# Patient Record
Sex: Male | Born: 1960 | Hispanic: No | Marital: Married | Smoking: Never smoker
Health system: Southern US, Community
[De-identification: ages and names within clinical notes are randomized; demographics above are authoritative.]

## PROBLEM LIST (undated history)

## (undated) HISTORY — PX: POLYPECTOMY: SHX149

## (undated) HISTORY — PX: COLONOSCOPY: SHX174

---

## 2002-02-27 ENCOUNTER — Emergency Department (HOSPITAL_COMMUNITY): Admission: EM | Admit: 2002-02-27 | Discharge: 2002-02-27 | Payer: Self-pay | Admitting: Emergency Medicine

## 2002-02-28 ENCOUNTER — Emergency Department (HOSPITAL_COMMUNITY): Admission: EM | Admit: 2002-02-28 | Discharge: 2002-02-28 | Payer: Self-pay | Admitting: Emergency Medicine

## 2002-03-10 ENCOUNTER — Emergency Department (HOSPITAL_COMMUNITY): Admission: EM | Admit: 2002-03-10 | Discharge: 2002-03-10 | Payer: Self-pay | Admitting: *Deleted

## 2010-05-17 ENCOUNTER — Encounter (INDEPENDENT_AMBULATORY_CARE_PROVIDER_SITE_OTHER): Payer: Self-pay | Admitting: *Deleted

## 2010-05-18 ENCOUNTER — Encounter (INDEPENDENT_AMBULATORY_CARE_PROVIDER_SITE_OTHER): Payer: Self-pay | Admitting: *Deleted

## 2010-05-20 ENCOUNTER — Ambulatory Visit: Payer: Self-pay | Admitting: Gastroenterology

## 2010-09-15 ENCOUNTER — Encounter (INDEPENDENT_AMBULATORY_CARE_PROVIDER_SITE_OTHER): Payer: Self-pay | Admitting: *Deleted

## 2010-09-16 ENCOUNTER — Ambulatory Visit
Admission: RE | Admit: 2010-09-16 | Discharge: 2010-09-16 | Payer: Self-pay | Source: Home / Self Care | Attending: Gastroenterology | Admitting: Gastroenterology

## 2010-09-30 ENCOUNTER — Other Ambulatory Visit: Payer: Self-pay | Admitting: Gastroenterology

## 2010-09-30 ENCOUNTER — Ambulatory Visit
Admission: RE | Admit: 2010-09-30 | Discharge: 2010-09-30 | Payer: Self-pay | Source: Home / Self Care | Attending: Gastroenterology | Admitting: Gastroenterology

## 2010-09-30 DIAGNOSIS — D126 Benign neoplasm of colon, unspecified: Secondary | ICD-10-CM

## 2010-10-04 ENCOUNTER — Encounter: Payer: Self-pay | Admitting: Gastroenterology

## 2010-10-06 NOTE — Procedures (Addendum)
Summary: Colonoscopy  Patient: [name] Note: All result statuses are Final unless otherwise noted.  Tests: (1) Colonoscopy (COL)   COL Colonoscopy           DONE     [facility]     [address]     [place], [place]  [address]           COLONOSCOPY PROCEDURE REPORT           PATIENT:  Jeremy Thompson, Jeremy Thompson  MR#:  [id number]     BIRTHDATE:  [birth year], 49 yrs. old  GENDER:  male     ENDOSCOPIST:  Judie Petit T. Russella Dar, MD, [facility]     Referred by:  Ancil Boozer, M.D.     PROCEDURE DATE:  09/30/2010     PROCEDURE:  Colonoscopy with biopsy and snare polypectomy     ASA CLASS:  Class I     INDICATIONS:  1) Routine Risk Screening     MEDICATIONS:   Fentanyl 50 mcg IV, Versed 6 mg IV     DESCRIPTION OF PROCEDURE:   After the risks benefits and     alternatives of the procedure were thoroughly explained, informed     consent was obtained.  Digital rectal exam was performed and     revealed no abnormalities.   The LB [id number]-[id number] endoscope     was introduced through the anus and advanced to the cecum, which     was identified by both the appendix and ileocecal valve, without     limitations.  The quality of the prep was good, using MoviPrep.     The instrument was then slowly withdrawn as the colon was fully     examined.     <<PROCEDUREIMAGES>>     FINDINGS:  A sessile polyp was found in the mid transverse colon.     It was 3 mm in size. The polyp was removed using cold biopsy     forceps.  A sessile polyp was found in the descending colon. It     was 5 mm in size. Polyp was snared without cautery. Retrieval was     successful. A sessile polyp was found in the sigmoid colon. It was     5 mm in size. Polyp was snared without cautery. Retrieval was     successful. A normal appearing cecum, ileocecal valve, and     appendiceal orifice were identified. The ascending, hepatic     flexure, splenic flexure, and rectum appeared unremarkable.     Retroflexed views in the rectum revealed no  abnormalities.  The time     to cecum =  1.5  minutes. The scope was then withdrawn (time =     13.33  min) from the patient and the procedure completed.           COMPLICATIONS:  None           ENDOSCOPIC IMPRESSION:     1) 3 mm sessile polyp in the mid transverse colon     2) 5 mm sessile polyp in the descending colon     3) 5 mm sessile polyp in the sigmoid colon           RECOMMENDATIONS:     1) Await pathology results     2) If the polyps are adenomatous (pre-cancerous) polyps,     colonoscopy in 5 years. Otherwise follow colorectal cancer     screening guidelines for "routine risk" patients with colonoscopy  in 10 years.           Venita Lick. Russella Dar, MD, [facility]           n.     eSIGNED:   Venita Lick. Stark at 09/30/2010 09:34 AM           Rayetta Humphrey, [id number]  Note: An exclamation mark (!) indicates a result that was not dispersed into the flowsheet. Document Creation Date: 09/30/2010 9:34 AM _______________________________________________________________________  (1) Order result status: Final Collection or observation date-time: 09/30/2010 09:29 Requested date-time:  Receipt date-time:  Reported date-time:  Referring Physician:   Ordering Physician: Claudette Head [phone]) Specimen Source:  Source: Launa Grill Order Number: [id number] Lab site:   Appended Document: Colonoscopy     Procedures Next Due Date:    Colonoscopy: 09/2015

## 2010-10-06 NOTE — Miscellaneous (Signed)
Summary: [facility] Previsit/prep  Clinical Lists Changes  Medications: Added new medication of MOVIPREP 100 GM  SOLR (PEG-KCL-NACL-NASULF-NA ASC-C) As per prep instructions. - Signed Rx of MOVIPREP 100 GM  SOLR (PEG-KCL-NACL-NASULF-NA ASC-C) As per prep instructions.;  #1 x 0;  Signed;  Entered by: Wyona Almas RN;  Authorized by: Meryl Dare MD [facility];  Method used: [facility]  [place] [address], [place], [place]  [address], Ph: [phone] or [phone], Fax: [phone] Observations: Added new observation of NKA: T (09/16/2010 15:25)    Prescriptions: MOVIPREP 100 GM  SOLR (PEG-KCL-NACL-NASULF-NA ASC-C) As per prep instructions.  #1 x 0   Entered by:   Wyona Almas RN   Authorized by:   Meryl Dare MD [facility]   Signed by:   Wyona Almas RN on 09/16/2010   Method used:   [facility] to        [facility]  [place] [address] (retail)       [address]       [place], [place]  [address]       Ph: [phone] or [phone]       Fax: [phone]   RxID:   [id number]

## 2010-10-06 NOTE — Miscellaneous (Signed)
Summary: previsit prep/rm  Clinical Lists Changes  Medications: Added new medication of MOVIPREP 100 GM  SOLR (PEG-KCL-NACL-NASULF-NA ASC-C) As per prep instructions. - Signed Rx of MOVIPREP 100 GM  SOLR (PEG-KCL-NACL-NASULF-NA ASC-C) As per prep instructions.;  #1 x 0;  Signed;  Entered by: Sherren Kerns RN;  Authorized by: Meryl Dare MD [facility];  Method used: [facility]  [place] [address], [place], [place]  [address], Ph: [phone] or [phone], Fax: [phone] Observations: Added new observation of ALLERGY REV: Done (05/20/2010 10:48) Added new observation of NKA: T (05/20/2010 10:48)    Prescriptions: MOVIPREP 100 GM  SOLR (PEG-KCL-NACL-NASULF-NA ASC-C) As per prep instructions.  #1 x 0   Entered by:   Sherren Kerns RN   Authorized by:   Meryl Dare MD [facility]   Signed by:   Sherren Kerns RN on 05/20/2010   Method used:   [facility]        [facility]  [place] [address] [place] (retail)       [address]       [place], [place]  [address]       Ph: [phone] or [phone]       Fax: [phone]   RxID:   [id number]

## 2010-10-06 NOTE — Letter (Signed)
Summary: Pre Visit Letter Revised  [facility]  [address] [place], [place] [address]   Phone: [phone]  Fax: [phone]        05/17/2010 MRN: [id number] Jeremy Thompson [address] [place], [place]  [address]             Procedure Date:  05/31/2010   Welcome to the Gastroenterology Division at [facility].    You are scheduled to see a nurse for your pre-procedure visit on 05/20/2010 at 11:00AM on the 3rd floor at [facility], [address]  We ask that you try to arrive at our office 15 minutes prior to your appointment time to allow for check-in.  Please take a minute to review the attached form.  If you answer "Yes" to one or more of the questions on the first page, we ask that you call the person listed at your earliest opportunity.  If you answer "No" to all of the questions, please complete the rest of the form and bring it to your appointment.    Your nurse visit will consist of discussing your medical and surgical history, your immediate family medical history, and your medications.   If you are unable to list all of your medications on the form, please bring the medication bottles to your appointment and we will list them.  We will need to be aware of both prescribed and over the counter drugs.  We will need to know exact dosage information as well.    Please be prepared to read and sign documents such as consent forms, a financial agreement, and acknowledgement forms.  If necessary, and with your consent, a friend or relative is welcome to sit-in on the nurse visit with you.  Please bring your insurance card so that we may make a copy of it.  If your insurance requires a referral to see a specialist, please bring your referral form from your primary care physician.  No co-pay is required for this nurse visit.     If you cannot keep your appointment, please call [phone] to cancel or reschedule prior to your appointment date.  This allows [place]  the opportunity to schedule an appointment for another patient in need of care.    Thank you for choosing  Gastroenterology for your medical needs.  We appreciate the opportunity to care for you.  Please visit [place] at our website  to learn more about our practice.  Sincerely, The Gastroenterology Division

## 2010-10-06 NOTE — Letter (Signed)
Summary: [facility] Instructions  [facility]  [address] [place], [place] [address]   Phone: [phone]  Fax: [phone]       Jeremy Thompson    1961-08-19    MRN: [id number]        Procedure Day /Date: Tuesday 05/31/2010     Arrival Time: 09:00AM     Procedure Time: 10:00AM     Location of Procedure:                    _X_  [facility] (4th Floor)                        PREPARATION FOR COLONOSCOPY WITH MOVIPREP   Starting 5 days prior to your procedure 05/26/2010 do not eat nuts, seeds, popcorn, corn, beans, peas,  salads, or any raw vegetables.  Do not take any fiber supplements (e.g. Metamucil, Citrucel, and Benefiber).  THE DAY BEFORE YOUR PROCEDURE         DATE:   09/26     DAY: Monday  1.  Drink clear liquids the entire day-NO SOLID FOOD  2.  Do not drink anything colored red or purple.  Avoid juices with pulp.  No orange juice.  3.  Drink at least 64 oz. (8 glasses) of fluid/clear liquids during the day to prevent dehydration and help the prep work efficiently.  CLEAR LIQUIDS INCLUDE: Water Jello Ice Popsicles Tea (sugar ok, no milk/cream) Powdered fruit flavored drinks Coffee (sugar ok, no milk/cream) Gatorade Juice: apple, white grape, white cranberry  Lemonade Clear bullion, consomm, broth Carbonated beverages (any kind) Strained chicken noodle soup Hard Candy                             4.  In the morning, mix first dose of MoviPrep solution:    Empty 1 Pouch A and 1 Pouch B into the disposable container    Add lukewarm drinking water to the top line of the container. Mix to dissolve    Refrigerate (mixed solution should be used within 24 hrs)  5.  Begin drinking the prep at 5:00 p.m. The MoviPrep container is divided by 4 marks.   Every 15 minutes drink the solution down to the next mark (approximately 8 oz) until the full liter is complete.   6.  Follow completed prep with 16 oz of clear liquid of your choice  (Nothing red or purple).  Continue to drink clear liquids until bedtime.  7.  Before going to bed, mix second dose of MoviPrep solution:    Empty 1 Pouch A and 1 Pouch B into the disposable container    Add lukewarm drinking water to the top line of the container. Mix to dissolve    Refrigerate  THE DAY OF YOUR PROCEDURE      DATE: 09/27     DAY: Tuesday  Beginning at 05:00AM  (5 hours before procedure):         1. Every 15 minutes, drink the solution down to the next mark (approx 8 oz) until the full liter is complete.  2. Follow completed prep with 16 oz. of clear liquid of your choice.    3. You may drink clear liquids until 08:00AM (2 HOURS BEFORE PROCEDURE).   MEDICATION INSTRUCTIONS  Unless otherwise instructed, you should take regular prescription medications with a small sip of water   as early  as possible the morning of your procedure.   Additional medication instructions: n/a         OTHER INSTRUCTIONS  You will need a responsible adult at least 52 years of age to accompany you and drive you home.   This person must remain in the waiting room during your procedure.  Wear loose fitting clothing that is easily removed.  Leave jewelry and other valuables at home.  However, you may wish to bring a book to read or  an iPod/MP3 player to listen to music as you wait for your procedure to start.  Remove all body piercing jewelry and leave at home.  Total time from sign-in until discharge is approximately 2-3 hours.  You should go home directly after your procedure and rest.  You can resume normal activities the  day after your procedure.  The day of your procedure you should not:   Drive   Make legal decisions   Operate machinery   Drink alcohol   Return to work  You will receive specific instructions about eating, activities and medications before you leave.    The above instructions have been reviewed and explained to me by   Sherren Kerns RN   May 20, 2010 11:33 AM     I fully understand and can verbalize these instructions _____________________________ Date _________

## 2010-10-06 NOTE — Letter (Signed)
Summary: [facility] Instructions  [facility]  [address] [place], [place] [address]   Phone: [phone]  Fax: [phone]       Jeremy Thompson    05-31-1961    MRN: [id number]        Procedure Day Dorna Bloom:  [name]  09/30/10     Arrival Time:  8:00AM     Procedure Time:  9:00AM     Location of Procedure:                    _ X_  [facility] (4th Floor)   PREPARATION FOR COLONOSCOPY WITH MOVIPREP   Starting 5 days prior to your procedure 09/25/10 do not eat nuts, seeds, popcorn, corn, beans, peas,  salads, or any raw vegetables.  Do not take any fiber supplements (e.g. Metamucil, Citrucel, and Benefiber).  THE DAY BEFORE YOUR PROCEDURE         DATE: 09/29/10  DAY: THURSDAY  1.  Drink clear liquids the entire day-NO SOLID FOOD  2.  Do not drink anything colored red or purple.  Avoid juices with pulp.  No orange juice.  3.  Drink at least 64 oz. (8 glasses) of fluid/clear liquids during the day to prevent dehydration and help the prep work efficiently.  CLEAR LIQUIDS INCLUDE: Water Jello Ice Popsicles Tea (sugar ok, no milk/cream) Powdered fruit flavored drinks Coffee (sugar ok, no milk/cream) Gatorade Juice: apple, white grape, white cranberry  Lemonade Clear bullion, consomm, broth Carbonated beverages (any kind) Strained chicken noodle soup Hard Candy                             4.  In the morning, mix first dose of MoviPrep solution:    Empty 1 Pouch A and 1 Pouch B into the disposable container    Add lukewarm drinking water to the top line of the container. Mix to dissolve    Refrigerate (mixed solution should be used within 24 hrs)  5.  Begin drinking the prep at 5:00 p.m. The MoviPrep container is divided by 4 marks.   Every 15 minutes drink the solution down to the next mark (approximately 8 oz) until the full liter is complete.   6.  Follow completed prep with 16 oz of clear liquid of your choice (Nothing red or purple).   Continue to drink clear liquids until bedtime.  7.  Before going to bed, mix second dose of MoviPrep solution:    Empty 1 Pouch A and 1 Pouch B into the disposable container    Add lukewarm drinking water to the top line of the container. Mix to dissolve    Refrigerate  THE DAY OF YOUR PROCEDURE      DATE: 09/30/10   DAY: FRIDAY  Beginning at 4:00AM (5 hours before procedure):         1. Every 15 minutes, drink the solution down to the next mark (approx 8 oz) until the full liter is complete.  2. Follow completed prep with 16 oz. of clear liquid of your choice.    3. You may drink clear liquids until 7:00AM (2 HOURS BEFORE PROCEDURE).   MEDICATION INSTRUCTIONS  Unless otherwise instructed, you should take regular prescription medications with a small sip of water   as early as possible the morning of your procedure.        OTHER INSTRUCTIONS  You will need a responsible adult at  least 52 years of age to accompany you and drive you home.   This person must remain in the waiting room during your procedure.  Wear loose fitting clothing that is easily removed.  Leave jewelry and other valuables at home.  However, you may wish to bring a book to read or  an iPod/MP3 player to listen to music as you wait for your procedure to start.  Remove all body piercing jewelry and leave at home.  Total time from sign-in until discharge is approximately 2-3 hours.  You should go home directly after your procedure and rest.  You can resume normal activities the  day after your procedure.  The day of your procedure you should not:   Drive   Make legal decisions   Operate machinery   Drink alcohol   Return to work  You will receive specific instructions about eating, activities and medications before you leave.    The above instructions have been reviewed and explained to me by  Wyona Almas RN  September 16, 2010 3:44 PM    I fully understand and can verbalize these  instructions _____________________________ Date _________

## 2010-10-12 NOTE — Letter (Signed)
Summary: Patient Notice- Polyp Results  [facility]  [address] [place], [place] [address]   Phone: [phone]  Fax: [phone]        October 04, 2010 MRN: [id number]    CORIE VAVRA [address] [place], [place]  [address]    Dear Mr. GRONINGER,  I am pleased to inform you that the colon polyp(s) removed during your recent colonoscopy was (were) found to be benign (no cancer detected) upon pathologic examination.  I recommend you have a repeat colonoscopy examination in 5 years to look for recurrent polyps, as having colon polyps increases your risk for having recurrent polyps or even colon cancer in the future.  Should you develop new or worsening symptoms of abdominal pain, bowel habit changes or bleeding from the rectum or bowels, please schedule an evaluation with either your primary care physician or with me.  Continue treatment plan as outlined the day of your exam.  Please call us if you are having persistent problems or have questions about your condition that have not been fully answered at this time.  Sincerely,  Meryl Dare MD [facility]  This letter has been electronically signed by your physician.  Appended Document: Patient Notice- Polyp Results LETTER MAILED

## 2015-05-24 ENCOUNTER — Other Ambulatory Visit: Payer: Self-pay | Admitting: Cardiology

## 2015-05-24 ENCOUNTER — Ambulatory Visit
Admission: RE | Admit: 2015-05-24 | Discharge: 2015-05-24 | Disposition: A | Discharge status: Home / Self Care | Payer: 59 | Source: Ambulatory Visit | Attending: Cardiology | Admitting: Cardiology

## 2015-05-24 DIAGNOSIS — R0789 Other chest pain: Secondary | ICD-10-CM

## 2015-09-16 ENCOUNTER — Encounter: Payer: Self-pay | Admitting: Gastroenterology

## 2015-10-08 ENCOUNTER — Ambulatory Visit (AMBULATORY_SURGERY_CENTER): Payer: Self-pay | Admitting: *Deleted

## 2015-10-08 VITALS — Ht 69.5 in | Wt 196.6 lb

## 2015-10-08 DIAGNOSIS — Z86010 Personal history of colonic polyps: Secondary | ICD-10-CM

## 2015-10-08 MED ORDER — NA SULFATE-K SULFATE-MG SULF 17.5-3.13-1.6 GM/177ML PO SOLN: 1.0000 | Freq: Once | ORAL | Status: DC

## 2015-10-08 NOTE — Progress Notes (Signed)
No egg or soy allergy known to patient  No issues with past sedation with any surgeries  or procedures, no intubation problems  No diet pills per patient No home 02 use per patient  No blood thinners per patient   emii dec;ined

## 2015-10-22 ENCOUNTER — Ambulatory Visit (AMBULATORY_SURGERY_CENTER): Payer: 59 | Admitting: Gastroenterology

## 2015-10-22 ENCOUNTER — Encounter: Payer: Self-pay | Admitting: Gastroenterology

## 2015-10-22 VITALS — BP 124/78 | HR 66 | Temp 97.7°F | Resp 19 | Ht 69.5 in | Wt 196.0 lb

## 2015-10-22 DIAGNOSIS — Z86010 Personal history of colonic polyps: Secondary | ICD-10-CM | POA: Diagnosis not present

## 2015-10-22 DIAGNOSIS — D124 Benign neoplasm of descending colon: Secondary | ICD-10-CM | POA: Diagnosis not present

## 2015-10-22 MED ORDER — SODIUM CHLORIDE 0.9 % IV SOLN: 500.0000 mL | INTRAVENOUS | Status: DC

## 2015-10-22 NOTE — Patient Instructions (Signed)
YOU HAD AN ENDOSCOPIC PROCEDURE TODAY AT [facility]:   Refer to the procedure report that was given to you for any specific questions about what was found during the examination.  If the procedure report does not answer your questions, please call your gastroenterologist to clarify.  If you requested that your care partner not be given the details of your procedure findings, then the procedure report has been included in a sealed envelope for you to review at your convenience later.  YOU SHOULD EXPECT: Some feelings of bloating in the abdomen. Passage of more gas than usual.  Walking can help get rid of the air that was put into your GI tract during the procedure and reduce the bloating. If you had a lower endoscopy (such as a colonoscopy or flexible sigmoidoscopy) you may notice spotting of blood in your stool or on the toilet paper. If you underwent a bowel prep for your procedure, you may not have a normal bowel movement for a few days.  Please Note:  You might notice some irritation and congestion in your nose or some drainage.  This is from the oxygen used during your procedure.  There is no need for concern and it should clear up in a day or so.  SYMPTOMS TO REPORT IMMEDIATELY:   Following lower endoscopy (colonoscopy or flexible sigmoidoscopy):  Excessive amounts of blood in the stool  Significant tenderness or worsening of abdominal pains  Swelling of the abdomen that is new, acute  Fever of 100F or higher   For urgent or emergent issues, a gastroenterologist can be reached at any hour by calling [phone].   DIET: Your first meal following the procedure should be a small meal and then it is ok to progress to your normal diet. Heavy or fried foods are harder to digest and may make you feel nauseous or bloated.  Likewise, meals heavy in dairy and vegetables can increase bloating.  Drink plenty of fluids but you should avoid alcoholic beverages for 24  hours.  ACTIVITY:  You should plan to take it easy for the rest of today and you should NOT DRIVE or use heavy machinery until tomorrow (because of the sedation medicines used during the test).    FOLLOW UP: Our staff will call the number listed on your records the next business day following your procedure to check on you and address any questions or concerns that you may have regarding the information given to you following your procedure. If we do not reach you, we will leave a message.  However, if you are feeling well and you are not experiencing any problems, there is no need to return our call.  We will assume that you have returned to your regular daily activities without incident.  If any biopsies were taken you will be contacted by phone or by letter within the next 1-3 weeks.  Please call us at [phone] if you have not heard about the biopsies in 3 weeks.    SIGNATURES/CONFIDENTIALITY: You and/or your care partner have signed paperwork which will be entered into your electronic medical record.  These signatures attest to the fact that that the information above on your After Visit Summary has been reviewed and is understood.  Full responsibility of the confidentiality of this discharge information lies with you and/or your care-partner.  Read all of the handouts given to you by your recovery room nurse.  Thank-you for choosing [place] for your healthcare needs today.

## 2015-10-22 NOTE — Progress Notes (Signed)
Report to PACU, RN, vss, BBS= Clear.  

## 2015-10-22 NOTE — Op Note (Signed)
[facility]  [address] [place], [address]   COLONOSCOPY PROCEDURE REPORT  PATIENT: Jeremy Thompson, Jeremy Thompson  MR#: [id number] BIRTHDATE: [birth year] , 26  yrs. old GENDER: male ENDOSCOPIST: Ladene Artist, MD, [facility] PROCEDURE DATE:  10/22/2015 PROCEDURE:   Colonoscopy, surveillance and Colonoscopy with snare polypectomy First Screening Colonoscopy - Avg.  risk and is 50 yrs.  old or older - No.  Prior Negative Screening - Now for repeat screening. N/A  History of Adenoma - Now for follow-up colonoscopy & has been > or = to 3 yrs.  Yes hx of adenoma.  Has been 3 or more years since last colonoscopy.  Polyps removed today? Yes ASA CLASS:   Class II INDICATIONS:Surveillance due to prior colonic neoplasia and PH Colon Adenoma. MEDICATIONS: Monitored anesthesia care and Propofol 200 mg IV  DESCRIPTION OF PROCEDURE:   After the risks benefits and alternatives of the procedure were thoroughly explained, informed consent was obtained.  The digital rectal exam revealed no abnormalities of the rectum.   The LB PFC-H190 [id number]  endoscope was introduced through the anus and advanced to the cecum, which was identified by both the appendix and ileocecal valve. No adverse events experienced.   The quality of the prep was good.  (Suprep was used)  The instrument was then slowly withdrawn as the colon was fully examined. Estimated blood loss is zero unless otherwise noted in this procedure report.    COLON FINDINGS: A sessile polyp measuring 6 mm in size was found in the descending colon.  A polypectomy was performed with a cold snare.  The resection was complete, the polyp tissue was completely retrieved and sent to histology.   The colonic mucosa appeared normal at the splenic flexure, in the transverse colon, rectum, sigmoid colon, at the ileocecal valve, cecum, hepatic flexure, and in the ascending colon.  Retroflexed views revealed no abnormalities. The time to cecum = 1.3  Withdrawal time = 9.2   The scope was withdrawn and the procedure completed. COMPLICATIONS: There were no immediate complications.  ENDOSCOPIC IMPRESSION: 1.   Sessile polyp in the descending colon; polypectomy performed with a cold snare 2.   The colonic mucosa otherwise appeared normal  RECOMMENDATIONS: 1.  Await pathology results 2.  Repeat Colonoscopy in 5 years.  eSigned:  Ladene Artist, MD, [facility] 10/22/2015 1:48 PM   cc: London Pepper, MD

## 2015-10-22 NOTE — Progress Notes (Signed)
Called to room to assist during endoscopic procedure.  Patient ID and intended procedure confirmed with present staff. Received instructions for my participation in the procedure from the performing physician.  

## 2015-10-25 ENCOUNTER — Telehealth: Payer: Self-pay | Admitting: *Deleted

## 2015-10-25 NOTE — Telephone Encounter (Signed)
  Follow up Call-  Call back number 10/22/2015  Post procedure Call Back phone  # [phone]  Permission to leave phone message Yes     No answer at # given.  Left message on VM.

## 2015-10-27 ENCOUNTER — Encounter: Payer: Self-pay | Admitting: Gastroenterology

## 2015-11-19 ENCOUNTER — Other Ambulatory Visit: Payer: Self-pay | Admitting: Physician Assistant

## 2016-05-19 IMAGING — CR DG CHEST 2V
2 series · 2 of 2 positions shown · non-contrast
Comparison: None.

CLINICAL DATA: New onset chest pain for 2 weeks.  Nonsmoker.

EXAM:
CHEST  2 VIEW

[w chest pa]
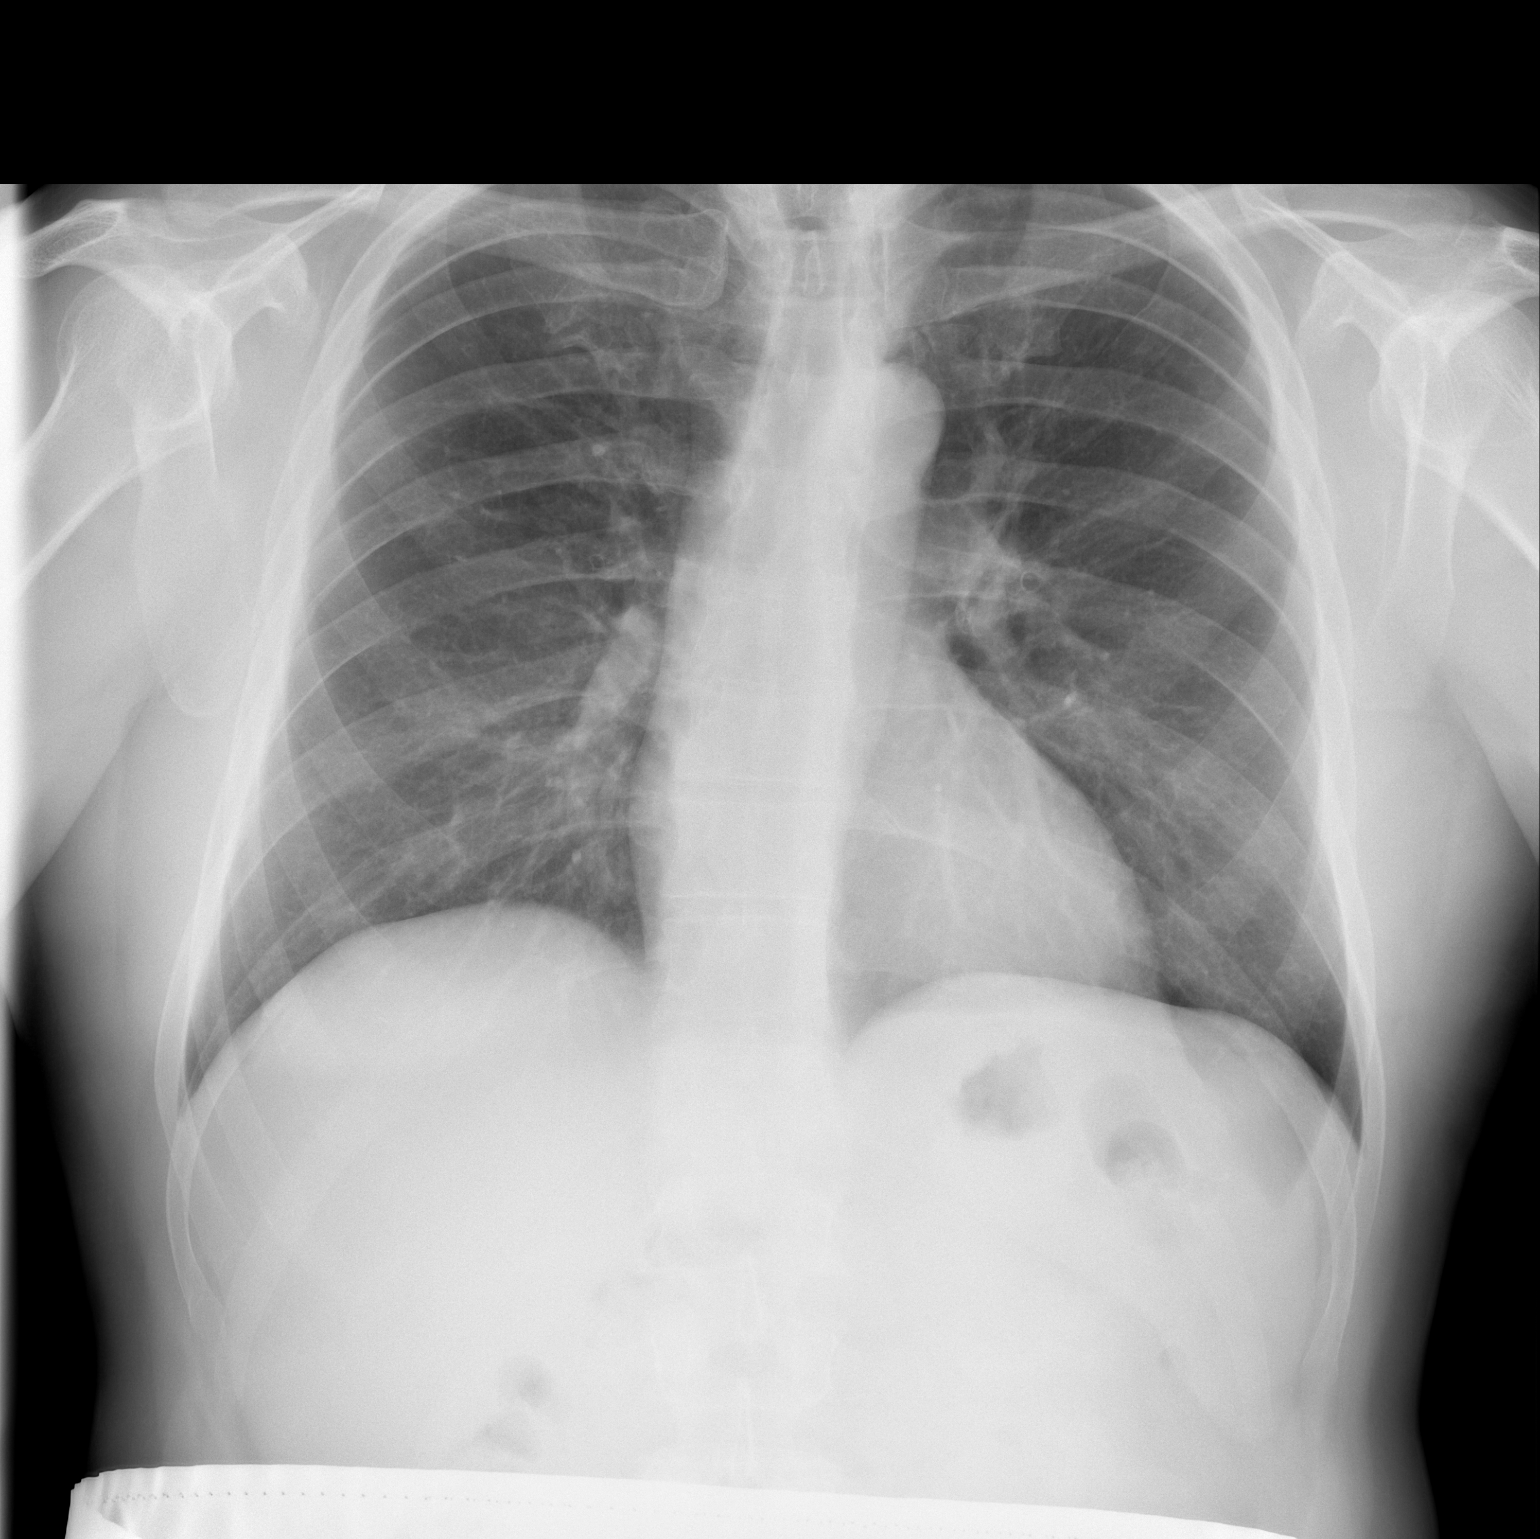

[w chest lat]
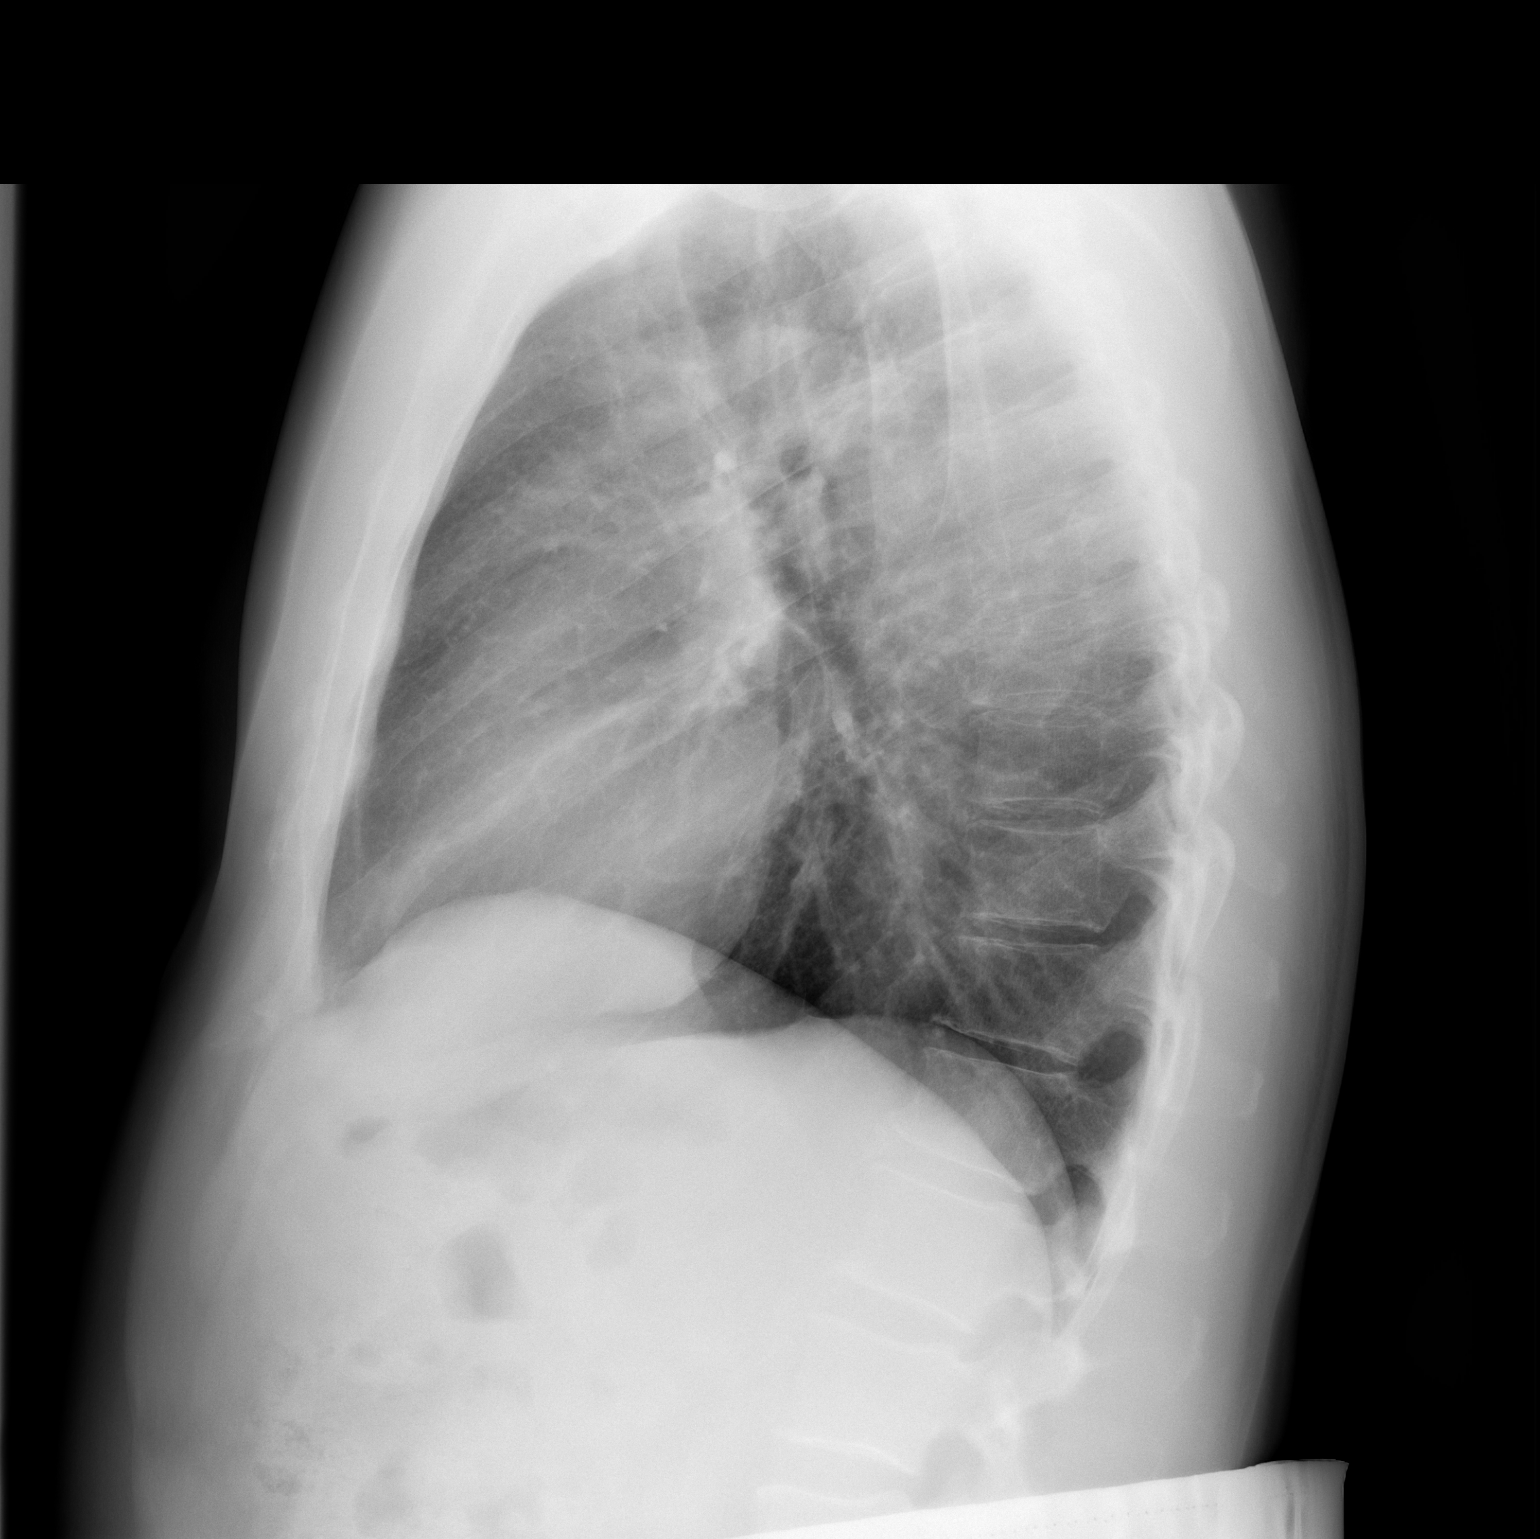

[2 of 2 positions shown; findings below may reference images not displayed]

FINDINGS: The heart size and mediastinal contours are within normal limits.
Both lungs are clear. No pleural effusion or pneumothorax. The
visualized skeletal structures are unremarkable.
IMPRESSION: No active cardiopulmonary disease.

## 2017-02-07 ENCOUNTER — Other Ambulatory Visit: Payer: Self-pay | Admitting: Physician Assistant

## 2021-01-04 ENCOUNTER — Ambulatory Visit: Payer: 59 | Admitting: Physician Assistant

## 2021-04-29 ENCOUNTER — Encounter: Payer: Self-pay | Admitting: Gastroenterology

## 2021-04-29 ENCOUNTER — Ambulatory Visit (AMBULATORY_SURGERY_CENTER): Payer: 59

## 2021-04-29 ENCOUNTER — Other Ambulatory Visit: Payer: Self-pay

## 2021-04-29 VITALS — Ht 69.5 in | Wt 190.0 lb

## 2021-04-29 DIAGNOSIS — Z86010 Personal history of colonic polyps: Secondary | ICD-10-CM

## 2021-04-29 MED ORDER — PEG-KCL-NACL-NASULF-NA ASC-C 100 G PO SOLR: 1.0000 | Freq: Once | ORAL | 0 refills | Status: AC

## 2021-04-29 NOTE — Progress Notes (Signed)
  Patient's pre-visit was done today over the phone with the patient   Name,DOB and address verified.   Patient denies any allergies to Eggs and Soy.  Patient denies any problems with anesthesia/sedation. Patient denies taking diet pills or blood thinners.  Denies atrial flutter or atrial fib Denies chronic constipation No home Oxygen.   Packet of Prep instructions mailed to patient including a copy of a consent form-pt is aware.  Patient understands to call us back with any questions or concerns.  Patient is aware of our care-partner policy and Covid-19 safety protocol.   The patient is COVID-19 vaccinated.    

## 2021-05-13 ENCOUNTER — Encounter: Payer: Self-pay | Admitting: Gastroenterology

## 2021-05-13 ENCOUNTER — Ambulatory Visit (AMBULATORY_SURGERY_CENTER): Payer: 59 | Admitting: Gastroenterology

## 2021-05-13 ENCOUNTER — Other Ambulatory Visit: Payer: Self-pay

## 2021-05-13 VITALS — BP 127/64 | HR 67 | Temp 98.4°F | Resp 16 | Ht 69.5 in | Wt 190.0 lb

## 2021-05-13 DIAGNOSIS — Z86010 Personal history of colonic polyps: Secondary | ICD-10-CM

## 2021-05-13 MED ORDER — SODIUM CHLORIDE 0.9 % IV SOLN
500.0000 mL | Freq: Once | INTRAVENOUS | Status: DC
Start: 2021-05-13 — End: 2021-05-13

## 2021-05-13 NOTE — Progress Notes (Signed)
A and O x3. Report to RN. Tolerated MAC anesthesia well. 

## 2021-05-13 NOTE — Op Note (Signed)
[facility] Patient Name: Jeremy Thompson Procedure Date: 05/13/2021 11:50 AM MRN: [id number] Endoscopist: Ladene Artist , MD Age: 60 Referring MD:  Date of Birth: 11-Nov-1960 Gender: Male Account #: [id number] Procedure:                Colonoscopy Indications:              Surveillance: Personal history of adenomatous                            polyps on last colonoscopy 5 years ago Medicines:                Monitored Anesthesia Care Procedure:                Pre-Anesthesia Assessment:                           - Prior to the procedure, a History and Physical                            was performed, and patient medications and                            allergies were reviewed. The patient's tolerance of                            previous anesthesia was also reviewed. The risks                            and benefits of the procedure and the sedation                            options and risks were discussed with the patient.                            All questions were answered, and informed consent                            was obtained. Prior Anticoagulants: The patient has                            taken no previous anticoagulant or antiplatelet                            agents. ASA Grade Assessment: II - A patient with                            mild systemic disease. After reviewing the risks                            and benefits, the patient was deemed in                            satisfactory condition to undergo the procedure.  After obtaining informed consent, the colonoscope                            was passed under direct vision. Throughout the                            procedure, the patient's blood pressure, pulse, and                            oxygen saturations were monitored continuously. The                            CF [id number] was introduced through the anus                            and advanced to the the  cecum, identified by                            appendiceal orifice and ileocecal valve. The                            ileocecal valve, appendiceal orifice, and rectum                            were photographed. The quality of the bowel                            preparation was good. The colonoscopy was performed                            without difficulty. The patient tolerated the                            procedure well. Scope In: 12:03:13 PM Scope Out: [id number] PM Scope Withdrawal Time: 0 hours 13 minutes 25 seconds  Total Procedure Duration: 0 hours 15 minutes 0 seconds  Findings:                 The perianal and digital rectal examinations were                            normal.                           The entire examined colon appeared normal on direct                            and retroflexion views. Complications:            No immediate complications. Estimated blood loss:                            None. Estimated Blood Loss:     Estimated blood loss: none. Impression:               - The entire examined colon is normal on direct and  retroflexion views.                           - No specimens collected. Recommendation:           - Repeat colonoscopy in 10 years for surveillance.                           - Patient has a contact number available for                            emergencies. The signs and symptoms of potential                            delayed complications were discussed with the                            patient. Return to normal activities tomorrow.                            Written discharge instructions were provided to the                            patient.                           - Resume previous diet.                           - Continue present medications. Ladene Artist, MD 05/13/2021 12:21:58 PM This report has been signed electronically.

## 2021-05-13 NOTE — Progress Notes (Deleted)
NAME: Jeremy Thompson   DOB: [birth year] MRN: [id number] Procedure Date: *** Arrival Time: *** Procedure Time: ***   Location of Procedure: [facility] Registration   _______________________________________________________________________  PREPARATION FOR COLONOSCOPY WITH CLENPIQ  On *** (5 days prior to the procedure) stop eating nuts, seeds, popcorn, corn, beans, peas, salads, or any raw vegetables.  Do not take any fiber supplements (e.g. Metamucil, Citrucel, and Benefiber).  Please pick up your prep from the pharmacy within 2-3 days of getting your prep instructions.  Please do NOT wait until the day before your procedure to pick it up as this will not give our office sufficient time to assist should pharmacy issues arise.  ______________________________________________________________________  THE DAY BEFORE PROCEDURE:   DATE: ***   DAY: {Time; days of week:30300}  1. Drink clear liquids the entire day - NO SOLID FOOD  2. Do not drink anything colored red or purple. Avoid juices with pulp. No orange juice, grapefruit, or tomato juice.  3. Drink at least 64 oz. (8 glasses) of fluid/clear liquids during the day to prevent dehydration and help the prep work efficiently.  Clear liquids include: (NO RED,NO PURPLE)    Water Jello  Ice Popsicles  Tea (sugar ok, no milk/cream) Powdered fruit flavored drinks  Coffee (sugar ok, no milk/cream) Gatorade  Juice: apple, white grape, white cranberry Lemonade  Clear bullion, consomme, broth Carbonated beverages (any kind)  Strained chicken noodle soup  (no noodles or chicken) Hard Candy    FOLLOW THESE PREP INSTRUCTIONS, NOT INSTRUCTIONS ON CLENPIQ BOX.  At 6:00pm:   Step 1: Drink one 5.4 oz bottle of CLENPIQ Step 2: Drink five (5) 8 oz. glasses of clear liquid over the next 3 hours. You may continue to drink additional clear liquids until bedtime.  ONLY DRINK ONE BOTTLE ( OR HALF)  OF PREP THE EVENING BEFORE YOUR PROCEDURE.   THE SECOND BOTTLE (OR HALF) MUST BE CONSUMED 6 HOURS BEFORE THE PROCEDURE ON THE DAY OF THE COLONOSCOPY.  If you have NOT had a bowel movement by 9-10 pm, please call the on call doctor at [phone]. Do not wait until you arrive for your procedure to notify us your prep did not work.  ________________________________________________________________________  Elana Alm OF PROCEDURE:   DATE: ***       DAY: {Time; days of week:30300}  STAY ON CLEAR LIQUIDS, NO SOLID FOOD  Beginning at  ***  (6 hours before the procedure):    Follow the instructions below.   Step 1:Drink the second 5.4 oz bottle of CLENPIQ Step 2: Drink five (5) 8 oz glasses of clear liquids over the next 2 hours  You should have nothing by mouth after *** (4 hours prior to procedure)  ________________________________________________________________________  MEDICATION INSTRUCTIONS  You will be contacted by the hospital endoscopy staff prior to your procedure with instructions on what medications to take prior to your procedure.   You should NOT stop any blood thinners, including Aspirin , unless instructed to do so by your gastroenterologist.   Take allowed medicines as directed by the Hospital  Diabetic patients - You will be contacted by the hospital endoscopy staff prior to your procedure with instructions on what medications to take prior to your procedure.   Stop taking Plavix or Aggrenox on *** (7 days before procedure)  Stop taking Coumadin on *** (5 days before procedure)   If you use any type of inhaler please bring them with you to your procedure.   Call office (  [phone]) if you have fever 2 days before procedure  PLEASE DO NOT WEAR ANY SCENTED LOTIONS, PERFUMES, OR COLOGNES THE DAY OF YOUR PROCEDURE   CARE PARTNER   You will need a responsible adult at least 62 years of age to act as your care partner on the day of your procedure. This person needs to arrive with you to the facility, stay there during  your procedure and then drive you home afterwards.  We cannot start your procedure unless your care partner is present in the facility. The total time from sign in until discharge is approximately 2-3 hours.  Before you leave, your doctor will review the findings and recommendations with you (and your care partner, if you give permission).  WHAT TO WEAR/BRING   Wear loose fitting clothing that is easily removed. Do not wear any lotions, perfumes or colognes. You may wear deodorant. Leave jewelry and other valuables at home. You may wish to bring a book to read as you wait for your procedure to start.  Belongings will be given to care partner to keep while you are in the procedure room.  Remove all body piercing jewelry and leave at home.  You should not wear any red or dark colored fingernail polish.  WHAT TO EXPECT AFTER THE PROCEDURE   You may experience some feelings of bloating in the abdomen or passage of more gas than usual.  Walking can help get rid of the air that was put into your GI tract during the procedure and reduce the bloating. You may notice spotting of blood in your stool or on the toilet paper. Since you completed a bowel prep for your procedure you may not have a normal bowel movement for a few days.  DIET   In general, your first meal following the procedure should be a small meal after which it is okay to return to your normal diet.  A half-sandwich or bowl of soup is an example of a good first meal.  Heavy or fried foods are harder to digest and may make you feel nauseous or bloated.  Drink plenty of fluids but you should avoid alcoholic beverages for 24 hours. These diet instructions may be modified depending on the results of your procedure.  ACTIVITY   Your care partner should take you home directly after the procedure.  You should plan to take it easy, moving slowly for the rest of the day.  You can resume normal activity the day after the procedure however you should NOT DRIVE  or use heavy machinery until the next day (because of the sedation medicines used during the procedure).    If any biopsies are taken during your procedure, you will be contacted by phone or by letter within the following 2-3 weeks.  Please call your gastroenterologist at [phone] if you have not heard about the biopsies in 3 weeks.   FREQUENTLY ASKED QUESTIONS SHOULD I STOP MY ASPIRIN? No, you should not stop any blood thinners, including Aspirin, unless instructed to do so by your gastroenterologist. WHEN DO I START THE PREP? Please refer to your personalized prep instructions regarding the start time.  We realize, however, that you may work until later than this time. If so, you can begin taking your preparation as soon as you get home.  Remember, the later you start, the later you will be up going to the bathroom. WHAT IF I GET SICK DURING THE PREP?  Stop drinking the solution and wait for 30-45 minutes.  Let your system settle down. Try drinking small sips of Coke or another clear liquid beverage.  Begin the solution again, using some of the suggestions above if the flavor is the problem.  WHEN WILL THE PREP START TO WORK?  Everyone is different in the amount of time that it takes the purge (laxative) to work. Some people begin to stool in the first hour, others not until the fourth hour or later. Activity is helpful in stimulating the bowel so, if possible, do not sit and wait for the bowel to act - remain active.  DO I HAVE TO DRINK ALL OF THE PREP?  Yes.  In order to give you the best examination possible, it is important to drink all of the solution in the set amount of time.  Some of the preps are "split dose," and are designed to work best if you drink the prep in two sittings. Your personalized intructions above will explain that if full detail. In the event that you have tried everything suggested and still cannot complete the preparation, please call us at [phone].  If it is after  hours or on the weekend, the answering service will reach the doctor on call for you. Please follow prep instructions to ensure that your colon is properly cleansed. If your colon is inadequately prepped, a repeat colonoscopy or further testing may be recommended. Should this happen, your insurance may not cover an additional procedure.   WHAT TYPES OF SEDATION ARE USED ? There are two types of sedation.  Your gastroenterologist will decide which type you will need based on your personal medical issues as well as their own preference. An IV of normal saline will be started, whether you will be receiving sedation or not, for your safety.   Moderate Sedation is achieved using a combination of an IV narcotic (fentanyl, Sublimaze) and an IV anxiolytic (midazolam, Versed). This results in a depressed state of consciousness which will allow you to be very relaxed and comfortable during the procedure but still able to respond to stimuli if needed.  You should not remember the procedure and will likely not remember the discharge instructions or discussion with the MD after the test is over.  You cannot drive or operate heavy machinery for 24 hours following the procedure.  You will not receive a separate bill for this type of sedation.  Occasionally this type of sedation is less effective than deep sedation for patients on certain chronic medications (pain medicines, antidepressants, antianxiety medicines) or in patients with a history of chronic alcohol use. Please bring a list of your medications the day of the procedure   Deep Sedation (Monitored Anesthesia Care) is achieved using a short acting IV anesthetic (diprivan, Propofol) that promotes relaxation and sleep.  This medicine is administered by a CRNA (nurse anesthetist) skilled and credentialed in [place].  You should not remember the procedure itself, but you should be able to remember the discharge instructions and the discussion with your  physician afterwards. You can resume normal activity the next day.    You will receive a separate bill for this type of sedation.  This type of sedation is generally more reliable for patients who take certain chronic medications or with certain   medical conditions and so it may be preferred. If you have any questions about the type of sedation that will be used for your procedure, your gastroenterologist will be happy to discuss it with you. WHY DOES MY CARE PARTNER HAVE TO  STAY DURING MY PROCEDURE? Endoscopic procedures are generally safe, but due to the risk of possible complications associated with the procedure and anesthesia it is our policy that someone be present during the procedure who will act as your spokesperson should the need arise for emergency intervention.  We cannot start the procedure unless your care partner is present in the facility waiting room. WHY DO I NEED A DRIVER?  The medicines used for your sedation cause delayed reflexes, impair thinking and judgment, and have some amnesic effect, therefore affecting your ability to drive safely. Even though you may feel alright, you are instructed to refrain from driving, operating any type of machinery, making any critical decisions, or signing any legal documents for 24 hours following your procedure.  WHAT SHOULD I BRING WITH ME?  Leave jewelry, purses and wallets at home. Wear loose fitting, easily removed clothing (e.g. no pantyhose or girdles).  You may wish to bring a book to read or another electronic device for entertainment while you wait for your procedure to start.  CAN I WEAR DENTURES?  Yes, you may wear your dentures. However, you may be asked to remove them prior to your procedure.  CAN I  WEAR MY CONTACTS?  We advise that you leave your contact lenses at home and wear your glasses instead. If you do wear you lenses, you may be asked to remove them prior to your procedure so please bring a case for them and a pair of  glasses to wear after your procedure.  IS THE TEST SAFE DURING MY MENSTRUAL PERIOD?  Yes, your procedure can still be performed.  WILL THE DOCTOR TALK WITH ME AFTERWARDS? Yes, your physician will review the findings, follow-up care instructions, and treatment recommendations with you.  This will also be reviewed with your care partner if you have explicitly given [place] permission.  It is important that your care partner realizes you may not remember much after the procedure due to the effects of the anesthesia and so they will need to be able to review this information with you later. HOW LONG WILL I BE THERE? The whole process takes 2-4 hours.  Most of that time is spent checking you in and waking you up safely after the procedure.  The colonoscopy itself takes 20-30 minutes usually.  Obviously, there may be unforseen delays and we will appreciate your patience if that occurs.  YOUR FINANCIAL RESPONSIBILITY If you have insurance, you will need to contact your insurance company to verify that you have active coverage and to determine the amount of coverage they will provide. They can tell you what portion of the cost will be your responsibility, usually expressed either as a fixed amount or as a percentage of overall cost. We will also be contacting your insurance company with information about your procedure in order to obtain pre-certification.  You must contact your insurance company as well, failure to do so may result in you having to pay a greater portion of the cost or even the total cost of the procedure.   Complaints or questions regarding billing, payment by third party payers or payment plans can be directed to the Customer Service Department of [facility], [address], [place], [place], [place] [address].  Phone inquiries may be made at [facility] health [phone], Monday through Friday 8am to 5 p.m., or you may visit the [facility] website at:  [email or web address], click on "For Patients", then click "Questions about your bill.  SIGNATURES/CONFIDENTIALITY  You have signed paperwork which will be entered into your electronic medical record.  This attests to the fact that that the information above on your After Visit Summary has been reviewed and is understood.  Full responsibility of the confidentiality of the printed copy of this After Visit Summary lies with you and/or your care-partner.No problems noted in the recovery room. maw

## 2021-05-13 NOTE — Progress Notes (Signed)
No problems noted in the recovery room. maw 

## 2021-05-13 NOTE — Progress Notes (Signed)
History & Physical  Primary Care Physician:  London Pepper, MD Primary Gastroenterologist: Jerilynn Mages. Fuller Plan, MD  CHIEF COMPLAINT:  Personal history of colon polyps   HPI: Jeremy Thompson is a 60 y.o. male with a history of adenomatous colon polyps who presents for surveillance colonoscopy.  He has no active gastrointestinal complaints.   History reviewed. No pertinent past medical history.  Past Surgical History:  Procedure Laterality Date   COLONOSCOPY     POLYPECTOMY      Prior to Admission medications   Medication Sig Start Date End Date Taking? Authorizing Provider  Multiple Vitamin (MULTIVITAMIN) tablet Take 1 tablet by mouth daily.   Yes [provider]  aspirin 81 MG chewable tablet Chew 81 mg by mouth daily. Patient not taking: No sig reported    [provider]  pseudoephedrine-acetaminophen (TYLENOL SINUS) 30-500 MG TABS tablet Take 1 tablet by mouth every 6 (six) hours as needed. Patient not taking: No sig reported    [provider]    Current Outpatient Medications  Medication Sig Dispense Refill   Multiple Vitamin (MULTIVITAMIN) tablet Take 1 tablet by mouth daily.     aspirin 81 MG chewable tablet Chew 81 mg by mouth daily. (Patient not taking: No sig reported)     pseudoephedrine-acetaminophen (TYLENOL SINUS) 30-500 MG TABS tablet Take 1 tablet by mouth every 6 (six) hours as needed. (Patient not taking: No sig reported)     Current Facility-Administered Medications  Medication Dose Route Frequency Provider Last Rate Last Admin   0.9 %  sodium chloride infusion  500 mL Intravenous Once Ladene Artist, MD        Allergies as of 05/13/2021   (No Known Allergies)    Family History  Problem Relation Age of Onset   Colon cancer Neg Hx    Colon polyps Neg Hx    Esophageal cancer Neg Hx    Rectal cancer Neg Hx    Stomach cancer Neg Hx     Social History   Socioeconomic History   Marital status: Married    Spouse name: Not on file    Number of children: Not on file   Years of education: Not on file   Highest education level: Not on file  Occupational History   Not on file  Tobacco Use   Smoking status: Never   Smokeless tobacco: Never  Vaping Use   Vaping Use: Never used  Substance and Sexual Activity   Alcohol use: Yes    Alcohol/week: 0.0 standard drinks    Comment: occ beer on the weekends    Drug use: No   Sexual activity: Not on file  Other Topics Concern   Not on file  Social History Narrative   Not on file   Social Determinants of Health   Financial Resource Strain: Not on file  Food Insecurity: Not on file  Transportation Needs: Not on file  Physical Activity: Not on file  Stress: Not on file  Social Connections: Not on file  Intimate Partner Violence: Not on file    Review of Systems:  All systems reviewed an negative except where noted in HPI.  Gen: Denies any fever, chills, sweats, anorexia, fatigue, weakness, malaise, weight loss, and sleep disorder CV: Denies chest pain, angina, palpitations, syncope, orthopnea, PND, peripheral edema, and claudication. Resp: Denies dyspnea at rest, dyspnea with exercise, cough, sputum, wheezing, coughing up blood, and pleurisy. GI: Denies vomiting blood, jaundice, and fecal incontinence.   Denies dysphagia or  odynophagia. GU : Denies urinary burning, blood in urine, urinary frequency, urinary hesitancy, nocturnal urination, and urinary incontinence. MS: Denies joint pain, limitation of movement, and swelling, stiffness, low back pain, extremity pain. Denies muscle weakness, cramps, atrophy.  Derm: Denies rash, itching, dry skin, hives, moles, warts, or unhealing ulcers.  Psych: Denies depression, anxiety, memory loss, suicidal ideation, hallucinations, paranoia, and confusion. Heme: Denies bruising, bleeding, and enlarged lymph nodes. Neuro:  Denies any headaches, dizziness, paresthesias. Endo:  Denies any problems with DM, thyroid, adrenal  function.   Physical Exam: General:  Alert, well-developed, in NAD Head:  Normocephalic and atraumatic. Eyes:  Sclera clear, no icterus.   Conjunctiva pink. Ears:  Normal auditory acuity. Mouth:  No deformity or lesions.  Neck:  Supple; no masses . Lungs:  Clear throughout to auscultation.   No wheezes, crackles, or rhonchi. No acute distress. Heart:  Regular rate and rhythm; no murmurs. Abdomen:  Soft, nondistended, nontender. No masses, hepatomegaly. No obvious masses.  Normal bowel .    Rectal:  Deferred   Msk:  Symmetrical without gross deformities.. Pulses:  Normal pulses noted. Extremities:  Without edema. Neurologic:  Alert and  oriented x4;  grossly normal neurologically. Skin:  Intact without significant lesions or rashes. Cervical Nodes:  No significant cervical adenopathy. Psych:  Alert and cooperative. Normal mood and affect.   Impression / Plan:   Personal history of adenomatous colon polyps for surveillance colonoscopy.   This patient is appropriate for endoscopic procedures in the ambulatory setting.    Pricilla Riffle. Fuller Plan  05/13/2021, 11:59 AM

## 2021-05-13 NOTE — Patient Instructions (Signed)
Repeat screening colonoscopy in 10 years. Please call if any questions or concerns.     YOU HAD AN ENDOSCOPIC PROCEDURE TODAY AT [facility]:   Refer to the procedure report that was given to you for any specific questions about what was found during the examination.  If the procedure report does not answer your questions, please call your gastroenterologist to clarify.  If you requested that your care partner not be given the details of your procedure findings, then the procedure report has been included in a sealed envelope for you to review at your convenience later.  YOU SHOULD EXPECT: Some feelings of bloating in the abdomen. Passage of more gas than usual.  Walking can help get rid of the air that was put into your GI tract during the procedure and reduce the bloating. If you had a lower endoscopy (such as a colonoscopy or flexible sigmoidoscopy) you may notice spotting of blood in your stool or on the toilet paper. If you underwent a bowel prep for your procedure, you may not have a normal bowel movement for a few days.  Please Note:  You might notice some irritation and congestion in your nose or some drainage.  This is from the oxygen used during your procedure.  There is no need for concern and it should clear up in a day or so.  SYMPTOMS TO REPORT IMMEDIATELY:  Following lower endoscopy (colonoscopy or flexible sigmoidoscopy):  Excessive amounts of blood in the stool  Significant tenderness or worsening of abdominal pains  Swelling of the abdomen that is new, acute  Fever of 100F or higher   For urgent or emergent issues, a gastroenterologist can be reached at any hour by calling [phone]. Do not use MyChart messaging for urgent concerns.    DIET:  We do recommend a small meal at first, but then you may proceed to your regular diet.  Drink plenty of fluids but you should avoid alcoholic beverages for 24 hours.  ACTIVITY:  You should plan to take it easy  for the rest of today and you should NOT DRIVE or use heavy machinery until tomorrow (because of the sedation medicines used during the test).    FOLLOW UP: Our staff will call the number listed on your records 48-72 hours following your procedure to check on you and address any questions or concerns that you may have regarding the information given to you following your procedure. If we do not reach you, we will leave a message.  We will attempt to reach you two times.  During this call, we will ask if you have developed any symptoms of COVID 19. If you develop any symptoms (ie: fever, flu-like symptoms, shortness of breath, cough etc.) before then, please call [phone].  If you test positive for Covid 19 in the 2 weeks post procedure, please call and report this information to [place].    If any biopsies were taken you will be contacted by phone or by letter within the next 1-3 weeks.  Please call us at [phone] if you have not heard about the biopsies in 3 weeks.    SIGNATURES/CONFIDENTIALITY: You and/or your care partner have signed paperwork which will be entered into your electronic medical record.  These signatures attest to the fact that that the information above on your After Visit Summary has been reviewed and is understood.  Full responsibility of the confidentiality of this discharge information lies with you and/or your care-partner.

## 2021-05-13 NOTE — Progress Notes (Signed)
Pt's states no medical or surgical changes since previsit or office visit.   VS taken by DT 

## 2021-05-17 ENCOUNTER — Telehealth: Payer: Self-pay

## 2021-05-17 NOTE — Telephone Encounter (Signed)
  Follow up Call-  Call back number 05/13/2021  Post procedure Call Back phone  # [phone]  Permission to leave phone message Yes  Some recent data might be hidden    Left message

## 2021-05-17 NOTE — Telephone Encounter (Signed)
Second post procedure follow up call, no answer 

## 2022-04-25 ENCOUNTER — Ambulatory Visit: Payer: 59 | Admitting: Physician Assistant
